# Patient Record
Sex: Male | Born: 2000 | Race: Black or African American | Hispanic: No | Marital: Single | Smoking: Never smoker
Health system: Southern US, Community
[De-identification: ages and names within clinical notes are randomized; demographics above are authoritative.]

## PROBLEM LIST (undated history)

## (undated) DIAGNOSIS — Z9109 Other allergy status, other than to drugs and biological substances: Secondary | ICD-10-CM

---
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
## 2019-02-21 ENCOUNTER — Other Ambulatory Visit: Payer: Self-pay

## 2019-02-21 ENCOUNTER — Emergency Department
Admission: EM | Admit: 2019-02-21 | Discharge: 2019-02-22 | Disposition: A | Discharge status: Home / Self Care | Payer: PRIVATE HEALTH INSURANCE | Attending: Student in an Organized Health Care Education/Training Program | Admitting: Student in an Organized Health Care Education/Training Program

## 2019-02-21 ENCOUNTER — Encounter: Payer: Self-pay | Admitting: Emergency Medicine

## 2019-02-21 DIAGNOSIS — R070 Pain in throat: Secondary | ICD-10-CM | POA: Diagnosis not present

## 2019-02-21 DIAGNOSIS — R509 Fever, unspecified: Secondary | ICD-10-CM | POA: Diagnosis not present

## 2019-02-21 DIAGNOSIS — J020 Streptococcal pharyngitis: Secondary | ICD-10-CM | POA: Insufficient documentation

## 2019-02-21 DIAGNOSIS — R05 Cough: Secondary | ICD-10-CM | POA: Diagnosis present

## 2019-02-21 LAB — INFLUENZA PANEL BY PCR (TYPE A & B)
INFLAPCR: NEGATIVE
INFLBPCR: NEGATIVE

## 2019-02-21 LAB — GROUP A STREP BY PCR: GROUP A STREP BY PCR: DETECTED — AB

## 2019-02-21 MED ORDER — AMOXICILLIN 500 MG PO CAPS
500.0000 mg | ORAL_CAPSULE | Freq: Once | ORAL | Status: AC
  Administered 2019-02-21: 500 mg via ORAL
  Filled 2019-02-21: qty 1

## 2019-02-21 MED ORDER — AMOXICILLIN 500 MG PO CAPS: 500.0000 mg | ORAL_CAPSULE | Freq: Three times a day (TID) | ORAL | 0 refills | Status: AC

## 2019-02-21 MED ORDER — PENICILLIN G BENZATHINE 1200000 UNIT/2ML IM SUSP
1.2000 10*6.[IU] | Freq: Once | INTRAMUSCULAR | Status: DC
Start: 2019-02-22 — End: 2019-02-21
  Filled 2019-02-21: qty 2

## 2019-02-21 NOTE — Discharge Instructions (Addendum)
You have tested positive for strep pharyngitis. You have be treated with amoxicillin. Continue to take Tylenol and Motrin for pain and fevers. Change your toothbrush in 24-hours. Return as needed.

## 2019-02-21 NOTE — ED Triage Notes (Signed)
Patient ambulatory to triage with steady gait, without difficulty or distress noted; pt reports sore throat, cough, fever x wk

## 2019-02-21 NOTE — ED Provider Notes (Signed)
[facility] Emergency Department Provider Note ____________________________________________  Time seen: 2340  I have reviewed the triage vital signs and the nursing notes.  HISTORY  Chief Complaint  Cough  HPI Douglas Sparks is a 18 y.o. male resents to the ED for evaluation of a 5-day complaint of sore throat, cough, and fevers.  Patient denies any nausea, vomiting, or dizziness.  He also denies any sick contacts, recent travel, or other exposures.   Past Medical History:  Diagnosis Date  . Environmental allergies     There are no active problems to display for this patient.   History reviewed. No pertinent surgical history.  Prior to Admission medications   Medication Sig Start Date End Date Taking? Authorizing Provider  diphenhydrAMINE (BENADRYL) 25 mg capsule Take 25 mg by mouth every 6 (six) hours as needed. allergies    [provider]  ibuprofen (ADVIL,MOTRIN) 200 MG tablet Take 400 mg by mouth every 6 (six) hours as needed. pain    [provider]  loratadine (CLARITIN) 10 MG tablet Take 10 mg by mouth daily.    [provider]    Allergies Patient has no known allergies.  No family history on file.  Social History Social History   Tobacco Use  . Smoking status: Never Smoker  . Smokeless tobacco: Never Used  Substance Use Topics  . Alcohol use: Not on file  . Drug use: Not on file    Review of Systems  Constitutional: Positive for fever. Eyes: Negative for visual changes. ENT: Positive for sore throat. Cardiovascular: Negative for chest pain. Respiratory: Negative for shortness of breath. Gastrointestinal: Negative for abdominal pain, vomiting and diarrhea. Genitourinary: Negative for dysuria. Musculoskeletal: Negative for back pain. Skin: Negative for rash. Neurological: Negative for headaches, focal weakness or numbness. ____________________________________________  PHYSICAL EXAM:  VITAL  SIGNS: ED Triage Vitals  Enc Vitals Group     BP 02/21/19 2300 (!) 143/84     Pulse Rate 02/21/19 2300 99     Resp 02/21/19 2300 18     Temp 02/21/19 2300 98.5 F (36.9 C)     Temp Source 02/21/19 2300 Oral     SpO2 02/21/19 2300 99 %     Weight 02/21/19 2256 125 lb (56.7 kg)     Height 02/21/19 2256 5\' 8"  (1.727 m)     Head Circumference --      Peak Flow --      Pain Score 02/21/19 2258 0     Pain Loc --      Pain Edu? --      Excl. in GC? --     Constitutional: Alert and oriented. Well appearing and in no distress. Head: Normocephalic and atraumatic. Eyes: Conjunctivae are normal. PERRL. Normal extraocular movements Ears: Canals clear. TMs intact bilaterally. Nose: No congestion/rhinorrhea/epistaxis. Mouth/Throat: Mucous membranes are moist.  Uvula is midline and tonsils are flat.  Mild generalized oropharyngeal erythema is appreciated.  No tonsillar exudate or edema is noted. Neck: Supple. No thyromegaly. Hematological/Lymphatic/Immunological: No cervical lymphadenopathy. Cardiovascular: Normal rate, regular rhythm. Normal distal pulses. Respiratory: Normal respiratory effort. No wheezes/rales/rhonchi. Gastrointestinal: Soft and nontender. No distention. Skin:  Skin is warm, dry and intact. No rash noted. ____________________________________________   LABS (pertinent positives/negatives)  Labs Reviewed  GROUP A STREP BY PCR - Abnormal; Notable for the following components:      Result Value   Group A Strep by PCR DETECTED (*)    All other components within normal limits  INFLUENZA  PANEL BY PCR (TYPE A & B)  ____________________________________________  PROCEDURES  Procedures Amoxicillin 500 mg PO ____________________________________________  INITIAL IMPRESSION / ASSESSMENT AND PLAN / ED COURSE  Patient with ED evaluation of a nearly 1 week complaint of sore throat, fevers, and cough.  Patient was found to be strep positive on his PCR test.  He is given his  initial dose of amoxicillin.  He will be encouraged to continue to monitor and treat fevers as necessary.  Return precautions have been reviewed. ____________________________________________  FINAL CLINICAL IMPRESSION(S) / ED DIAGNOSES  Final diagnoses:  Strep pharyngitis      Amyra Vantuyl, Charlesetta Ivory, PA-C 02/21/19 2357    Willy Eddy, MD 02/22/19 0006

## 2019-02-21 NOTE — ED Notes (Signed)
Cough and congestion x 1 week. Has had a fever at one point.

## 2020-07-23 ENCOUNTER — Emergency Department (HOSPITAL_COMMUNITY)
Admission: EM | Admit: 2020-07-23 | Discharge: 2020-07-24 | Disposition: A | Discharge status: Home / Self Care | Payer: No Typology Code available for payment source | Attending: Emergency Medicine | Admitting: Emergency Medicine

## 2020-07-23 ENCOUNTER — Encounter (HOSPITAL_COMMUNITY): Payer: Self-pay | Admitting: Emergency Medicine

## 2020-07-23 ENCOUNTER — Emergency Department (HOSPITAL_COMMUNITY): Payer: No Typology Code available for payment source

## 2020-07-23 DIAGNOSIS — Y999 Unspecified external cause status: Secondary | ICD-10-CM | POA: Insufficient documentation

## 2020-07-23 DIAGNOSIS — X58XXXA Exposure to other specified factors, initial encounter: Secondary | ICD-10-CM | POA: Insufficient documentation

## 2020-07-23 DIAGNOSIS — Y9289 Other specified places as the place of occurrence of the external cause: Secondary | ICD-10-CM | POA: Insufficient documentation

## 2020-07-23 DIAGNOSIS — T23012A Burn of unspecified degree of left thumb (nail), initial encounter: Secondary | ICD-10-CM | POA: Diagnosis not present

## 2020-07-23 DIAGNOSIS — Y939 Activity, unspecified: Secondary | ICD-10-CM | POA: Insufficient documentation

## 2020-07-23 NOTE — ED Notes (Signed)
PT ADVISED HE WAS LEAVING DUE TO WAIT TIME
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
## 2020-07-24 ENCOUNTER — Other Ambulatory Visit: Payer: Self-pay | Admitting: Family Medicine

## 2020-07-24 ENCOUNTER — Ambulatory Visit: Payer: Self-pay

## 2020-07-24 ENCOUNTER — Other Ambulatory Visit: Payer: Self-pay

## 2020-07-24 DIAGNOSIS — M79645 Pain in left finger(s): Secondary | ICD-10-CM
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
